# Patient Record
Sex: Female | Born: 2005 | Race: White | Hispanic: No | Marital: Single | Smoking: Never smoker
Health system: Southern US, Community
[De-identification: ages and names within clinical notes are randomized; demographics above are authoritative.]

## PROBLEM LIST (undated history)

## (undated) DIAGNOSIS — J309 Allergic rhinitis, unspecified: Secondary | ICD-10-CM

## (undated) HISTORY — DX: Allergic rhinitis, unspecified: J30.9

## (undated) HISTORY — PX: NO PAST SURGERIES: SHX2092

---
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
## 2024-01-23 ENCOUNTER — Encounter: Admitting: Family Medicine

## 2024-03-08 ENCOUNTER — Encounter: Payer: Self-pay | Admitting: Physician Assistant

## 2024-03-08 ENCOUNTER — Ambulatory Visit (INDEPENDENT_AMBULATORY_CARE_PROVIDER_SITE_OTHER): Admitting: Physician Assistant

## 2024-03-08 VITALS — BP 113/72 | HR 118 | Resp 16 | Ht 59.45 in | Wt 157.5 lb

## 2024-03-08 DIAGNOSIS — F419 Anxiety disorder, unspecified: Secondary | ICD-10-CM

## 2024-03-08 DIAGNOSIS — E669 Obesity, unspecified: Secondary | ICD-10-CM

## 2024-03-08 DIAGNOSIS — Z30011 Encounter for initial prescription of contraceptive pills: Secondary | ICD-10-CM | POA: Diagnosis not present

## 2024-03-08 DIAGNOSIS — N921 Excessive and frequent menstruation with irregular cycle: Secondary | ICD-10-CM

## 2024-03-08 DIAGNOSIS — Z7689 Persons encountering health services in other specified circumstances: Secondary | ICD-10-CM

## 2024-03-08 LAB — POCT URINE PREGNANCY: Preg Test, Ur: NEGATIVE

## 2024-03-08 MED ORDER — SERTRALINE HCL 25 MG PO TABS: 25.0000 mg | ORAL_TABLET | Freq: Every day | ORAL | 1 refills | Status: AC

## 2024-03-08 MED ORDER — HYDROXYZINE HCL 10 MG PO TABS: 10.0000 mg | ORAL_TABLET | Freq: Three times a day (TID) | ORAL | 0 refills | Status: DC | PRN

## 2024-03-08 MED ORDER — NORETHIN ACE-ETH ESTRAD-FE 1-20 MG-MCG PO TABS: 1.0000 | ORAL_TABLET | Freq: Every day | ORAL | 4 refills | Status: AC

## 2024-03-08 NOTE — Progress Notes (Signed)
 Established patient visit  Patient: Norma Moore   DOB: [birth year]   17 y.o. Female  MRN: [id number] Visit Date: 03/08/2024  Today's healthcare provider: Jolynn Spencer, PA-C   Chief Complaint  Patient presents with   Transitions Of Care   Contraception   Anxiety    Discuss treatment for anxiety   Subjective     HPI     Anxiety    Additional comments: Discuss treatment for anxiety      Last edited by Wilfred Hargis RAMAN, CMA on 03/08/2024  2:32 PM.       Discussed the use of AI scribe software for clinical note transcription with the patient, who gave verbal consent to proceed.  History of Present Illness Norma Moore is an 17 year old female who presents with anxiety and heavy menstrual periods.  She experiences heavy menstrual periods throughout the entire cycle, with severe cramps on the first day. Previously, her symptoms were managed with the contraceptive pill, but she is not currently using any birth control.  She has increased anxiety over the past two and a half weeks, particularly severe at night, causing difficulty sitting still. Her mother has a history of anxiety and takes Zoloft . She has had anxiety in the past, but it has not required medication until now.  She denies chest pain, shortness of breath, palpitations, or bowel movement issues.       03/08/2024    2:39 PM 05/27/2022    3:39 PM 04/05/2018    1:45 PM  Depression screen PHQ 2/9  Decreased Interest 0 3 0  Down, Depressed, Hopeless 1 2 0  PHQ - 2 Score 1 5 0  Altered sleeping 1 2 0  Tired, decreased energy 0 3 0  Change in appetite 2 1 0  Feeling bad or failure about yourself  0 3 0  Trouble concentrating 0 3 0  Moving slowly or fidgety/restless 0 0 0  Suicidal thoughts 0 2 0  PHQ-9 Score 4 19 0  Difficult doing work/chores Not difficult at all Very difficult Not difficult at all      03/08/2024    2:39 PM  GAD 7 : Generalized Anxiety Score  Nervous, Anxious, on Edge 3  Control/stop  worrying 3  Worry too much - different things 3  Trouble relaxing 3  Restless 2  Easily annoyed or irritable 3  Afraid - awful might happen 3  Total GAD 7 Score 20  Anxiety Difficulty Somewhat difficult    Medications: Outpatient Medications Prior to Visit  Medication Sig   [DISCONTINUED] norethindrone-ethinyl estradiol-FE (LOESTRIN FE) 1-20 MG-MCG tablet Take 1 tablet by mouth daily.   No facility-administered medications prior to visit.    Review of Systems All negative Except see HPI       Objective    BP 113/72 (BP Location: Right Arm, Patient Position: Sitting, Cuff Size: Large)   Pulse (!) 118   Resp 16   Ht 4' 11.45 (1.51 m)   Wt 157 lb 8 oz (71.4 kg)   LMP 02/20/2024   SpO2 98%   BMI 31.33 kg/m     Physical Exam Vitals reviewed.  Constitutional:      General: She is not in acute distress.    Appearance: Normal appearance. She is well-developed. She is not diaphoretic.  HENT:     Head: Normocephalic and atraumatic.  Eyes:     General: No scleral icterus.    Conjunctiva/sclera: Conjunctivae normal.  Neck:  Thyroid : No thyromegaly.  Cardiovascular:     Rate and Rhythm: Normal rate and regular rhythm.     Pulses: Normal pulses.     Heart sounds: Normal heart sounds. No murmur heard. Pulmonary:     Effort: Pulmonary effort is normal. No respiratory distress.     Breath sounds: Normal breath sounds. No wheezing, rhonchi or rales.  Musculoskeletal:     Cervical back: Neck supple.     Right lower leg: No edema.     Left lower leg: No edema.  Lymphadenopathy:     Cervical: No cervical adenopathy.  Skin:    General: Skin is warm and dry.     Findings: No rash.  Neurological:     Mental Status: She is alert and oriented to person, place, and time. Mental status is at baseline.  Psychiatric:        Mood and Affect: Mood normal.        Behavior: Behavior normal.      Results for orders placed or performed in visit on 03/08/24  POCT urine  pregnancy  Result Value Ref Range   Preg Test, Ur Negative Negative        Assessment & Plan Anxiety disorder Anxiety increased over two and a half weeks, severe at night, causing restlessness and sleep difficulty. No prior medication, family history present. Discussed medication and counseling benefits. Hydroxyzine  Atarax  suggested for immediate relief, potential transition to sertraline  (Zoloft ). Counseling recommended to enhance treatment efficacy.  Patient declined - Prescribed hydroxyzine  Atarax , small dosage, up to three times daily as needed. Advised caution with machinery or driving. - Prescribed sertraline  (Zoloft ) 25 mg, start with half a tablet. Monitor response over six weeks. - Recommended in-house counseling services, covered by insurance, or through school/work if convenient. - Scheduled follow-up in two months to assess medication efficacy and progress. Will follow-up  Heavy menstrual bleeding with dysmenorrhea Heavy menstrual bleeding with severe cramps on the first day. Previously managed with oral contraceptive pills. Further evaluation with imaging and possible OBGYN referral if symptoms persist. - Consider OTC NSAIDs for pain relief, with caution for stomach lining issues. - Monitor symptoms and consider further evaluation if symptoms persist.  Visit for oral contraceptive prescription (Primary)  - POCT urine pregnancy - norethindrone-ethinyl estradiol-FE (LOESTRIN FE) 1-20 MG-MCG tablet; Take 1 tablet by mouth daily.  Dispense: 84 tablet; Refill: 4  Anxiety  - sertraline  (ZOLOFT ) 25 MG tablet; Take 1 tablet (25 mg total) by mouth daily.  Dispense: 30 tablet; Refill: 1 - hydrOXYzine  (ATARAX ) 10 MG tablet; Take 1 tablet (10 mg total) by mouth 3 (three) times daily as needed.  Dispense: 30 tablet; Refill: 0 - Lipid panel  Obesity (BMI 30-39.9) Chronic and stable Body mass index is 31.33 kg/m. Weight loss of 5% of pt's current weight via healthy diet and daily  exercise encouraged. Workup ordered - Lipid panel - Hemoglobin A1c - Comprehensive metabolic panel with GFR - CBC with Differential/Platelet - TSH Will reassess after  receiving lab results   Orders Placed This Encounter  Procedures   Lipid panel    Has the patient fasted?:   Yes   Hemoglobin A1c   Comprehensive metabolic panel with GFR    Has the patient fasted?:   Yes   CBC with Differential/Platelet   TSH   POCT urine pregnancy    Return in about 8 weeks (around 05/03/2024) for CPE, anxiety fu.   The patient was advised to call back or seek an in-person evaluation  if the symptoms worsen or if the condition fails to improve as anticipated.  I discussed the assessment and treatment plan with the patient. The patient was provided an opportunity to ask questions and all were answered. The patient agreed with the plan and demonstrated an understanding of the instructions.  I, Tarrin Lebow, PA-C have reviewed all documentation for this visit. The documentation on 03/08/2024  for the exam, diagnosis, procedures, and orders are all accurate and complete.  Jolynn Spencer, [facility], MMS [facility] [phone] (phone) [phone] (fax)  [facility]

## 2024-03-13 DIAGNOSIS — Z30011 Encounter for initial prescription of contraceptive pills: Secondary | ICD-10-CM | POA: Insufficient documentation

## 2024-03-13 DIAGNOSIS — F419 Anxiety disorder, unspecified: Secondary | ICD-10-CM | POA: Insufficient documentation

## 2024-03-13 DIAGNOSIS — N921 Excessive and frequent menstruation with irregular cycle: Secondary | ICD-10-CM | POA: Insufficient documentation

## 2024-03-13 DIAGNOSIS — E669 Obesity, unspecified: Secondary | ICD-10-CM | POA: Insufficient documentation

## 2024-03-13 DIAGNOSIS — N922 Excessive menstruation at puberty: Secondary | ICD-10-CM | POA: Insufficient documentation

## 2024-05-02 ENCOUNTER — Ambulatory Visit: Admitting: Physician Assistant

## 2024-05-02 ENCOUNTER — Encounter: Payer: Self-pay | Admitting: Physician Assistant

## 2024-05-02 VITALS — BP 117/83 | HR 93 | Ht 59.0 in | Wt 162.5 lb

## 2024-05-02 DIAGNOSIS — Z23 Encounter for immunization: Secondary | ICD-10-CM | POA: Diagnosis not present

## 2024-05-02 DIAGNOSIS — Z0001 Encounter for general adult medical examination with abnormal findings: Secondary | ICD-10-CM

## 2024-05-02 DIAGNOSIS — E669 Obesity, unspecified: Secondary | ICD-10-CM | POA: Diagnosis not present

## 2024-05-02 DIAGNOSIS — F419 Anxiety disorder, unspecified: Secondary | ICD-10-CM

## 2024-05-02 MED ORDER — HYDROXYZINE HCL 10 MG PO TABS
10.0000 mg | ORAL_TABLET | Freq: Three times a day (TID) | ORAL | 2 refills | Status: AC | PRN
Start: 2024-05-02 — End: ?

## 2024-05-02 NOTE — Progress Notes (Signed)
 Established patient visit  Patient: Norma Moore   DOB: 16-Aug-2005   17 y.o. Female  MRN: [id number] Visit Date: 05/02/2024  Today's healthcare provider: Jolynn Spencer, PA-C   Chief Complaint  Patient presents with   Annual Exam   Medical Management of Chronic Issues    Anxiety follow-up   Subjective     HPI     Medical Management of Chronic Issues    Additional comments: Anxiety follow-up      Last edited by Rosas, Joseline E, CMA on 05/02/2024  3:01 PM.       Discussed the use of AI scribe software for clinical note transcription with the patient, who gave verbal consent to proceed.  History of Present Illness Norma Moore is an 18 year old female who presents for vaccination updates and anxiety management.  She is due for vaccination updates, receiving her first meningitis shot and second HPV shot, having missed the latter due to a change in healthcare location.  She has anxiety and depression, previously managed with Zoloft  and hydroxyzine . Hydroxyzine  provided more relief, taken three times daily, sometimes two at a time during heightened anxiety. She is currently out of both medications.  She experiences occasional nasal congestion and a runny nose, not attributed to seasonal allergies. No heat or cold intolerance, lumps in the neck, hoarseness, fatigue, or tiredness.  Her bowel movements are regular, and urination is normal without pain. She is on birth control, which has caused appetite and weight fluctuations. Menstrual bleeding has improved since starting birth control, with her last period on August 23rd being significantly better.  She recently graduated high school, is seeking employment, and plans to work before attending college. She is considering a career in bartending.       05/02/2024    2:59 PM 03/08/2024    2:39 PM 05/27/2022    3:39 PM  Depression screen PHQ 2/9  Decreased Interest 0 0 3  Down, Depressed, Hopeless 0 1 2  PHQ - 2 Score 0 1 5   Altered sleeping 0 1 2  Tired, decreased energy 0 0 3  Change in appetite 0 2 1  Feeling bad or failure about yourself  0 0 3  Trouble concentrating 0 0 3  Moving slowly or fidgety/restless 0 0 0  Suicidal thoughts 0 0 2  PHQ-9 Score 0 4 19  Difficult doing work/chores Not difficult at all Not difficult at all Very difficult      05/02/2024    3:00 PM 03/08/2024    2:39 PM  GAD 7 : Generalized Anxiety Score  Nervous, Anxious, on Edge 1 3  Control/stop worrying 0 3  Worry too much - different things 0 3  Trouble relaxing 0 3  Restless 0 2  Easily annoyed or irritable 0 3  Afraid - awful might happen 0 3  Total GAD 7 Score 1 20  Anxiety Difficulty Not difficult at all Somewhat difficult    Medications: Outpatient Medications Prior to Visit  Medication Sig   hydrOXYzine  (ATARAX ) 10 MG tablet Take 1 tablet (10 mg total) by mouth 3 (three) times daily as needed.   norethindrone-ethinyl estradiol-FE (LOESTRIN FE) 1-20 MG-MCG tablet Take 1 tablet by mouth daily.   sertraline  (ZOLOFT ) 25 MG tablet Take 1 tablet (25 mg total) by mouth daily.   No facility-administered medications prior to visit.    Review of Systems All negative Except see HPI       Objective    BP 117/83 (  BP Location: Right Arm, Patient Position: Sitting, Cuff Size: Large)   Pulse 93   Ht 4' 11 (1.499 m)   Wt 162 lb 8 oz (73.7 kg)   LMP 03/31/2024   SpO2 100%   BMI 32.82 kg/m     Physical Exam Vitals reviewed.  Constitutional:      General: She is not in acute distress.    Appearance: Normal appearance. She is well-developed. She is not ill-appearing, toxic-appearing or diaphoretic.  HENT:     Head: Normocephalic and atraumatic.     Right Ear: Tympanic membrane, ear canal and external ear normal.     Left Ear: Tympanic membrane, ear canal and external ear normal.     Nose: Congestion and rhinorrhea present.     Mouth/Throat:     Mouth: Mucous membranes are moist.     Pharynx: Oropharynx is  clear. No oropharyngeal exudate.  Eyes:     General: No scleral icterus.       Right eye: No discharge.        Left eye: No discharge.     Conjunctiva/sclera: Conjunctivae normal.     Pupils: Pupils are equal, round, and reactive to light.  Neck:     Thyroid : No thyromegaly.     Vascular: No carotid bruit.  Cardiovascular:     Rate and Rhythm: Normal rate and regular rhythm.     Pulses: Normal pulses.     Heart sounds: Normal heart sounds. No murmur heard.    No friction rub. No gallop.  Pulmonary:     Effort: Pulmonary effort is normal. No respiratory distress.     Breath sounds: Normal breath sounds. No wheezing or rales.  Abdominal:     General: Abdomen is flat. Bowel sounds are normal. There is no distension.     Palpations: Abdomen is soft. There is no mass.     Tenderness: There is no abdominal tenderness. There is no right CVA tenderness, left CVA tenderness, guarding or rebound.     Hernia: No hernia is present.  Musculoskeletal:        General: No swelling, tenderness, deformity or signs of injury. Normal range of motion.     Cervical back: Normal range of motion and neck supple. No rigidity or tenderness.     Right lower leg: No edema.     Left lower leg: No edema.  Lymphadenopathy:     Cervical: No cervical adenopathy.  Skin:    General: Skin is warm and dry.     Coloration: Skin is not jaundiced or pale.     Findings: No bruising, erythema, lesion or rash.  Neurological:     Mental Status: She is alert and oriented to person, place, and time. Mental status is at baseline.     Gait: Gait normal.  Psychiatric:        Mood and Affect: Mood normal.        Behavior: Behavior normal.        Thought Content: Thought content normal.        Judgment: Judgment normal.      No results found for any visits on 05/02/24.      Assessment and Plan Assessment & Plan Annual physical exam Well Child Visit Routine visit for 18 year old female, no acute concerns. Recent  dental visit for cavity fillings. No issues with exercise or sleep. - Schedule follow-up in one year unless issues arise.  Anticipatory Guidance Discussed vaccinations including meningitis and HPV, emphasized importance of completing  vaccination schedule. Discussed nasal congestion and potential seasonal allergies. Advised on COVID-19 precautions. - Schedule first meningitis vaccine. - Administer second HPV vaccine. - Schedule follow-up for meningitis vaccine in six weeks. - Advise wearing a mask indoors due to COVID-19. - Recommend nasal saline spray, Flonase, or antihistamines for nasal congestion.  Nasal congestion Intermittent nasal congestion with runny nose, possibly related to seasonal changes. - Advise use of nasal saline spray, Flonase, or antihistamines as needed.  Anxiety and Depression Chronic and stable Anxiety and depression managed with hydroxyzine , better response than Zoloft . Symptoms well-managed with medication and lifestyle changes. No current need for counseling. - Prescribe hydroxyzine  90 tablets with refills for two months. - Discontinue Zoloft . - Reassess medication needs in three months. Consider collaborative care if symptoms worsen Will follow-up  Anxiety (Primary)  - hydrOXYzine  (ATARAX ) 10 MG tablet; Take 1 tablet (10 mg total) by mouth 3 (three) times daily as needed for anxiety.  Dispense: 90 tablet; Refill: 2  Need for meningococcal vaccination  - Meningococcal B, OMV  Need for HPV vaccination  - HPV 9-valent vaccine,Recombinat  Obesity (BMI 30-39.9) Chronic Stable Body mass index is 32.82 kg/m.  Weight loss of 5% of pt's current weight via healthy diet and daily exercise encouraged. Lab work from 03/08/24 pending Will follow-up    No orders of the defined types were placed in this encounter.   No follow-ups on file.   The patient was advised to call back or seek an in-person evaluation if the symptoms worsen or if the condition fails  to improve as anticipated.  I discussed the assessment and treatment plan with the patient. The patient was provided an opportunity to ask questions and all were answered. The patient agreed with the plan and demonstrated an understanding of the instructions.  I, Kadan Millstein, PA-C have reviewed all documentation for this visit. The documentation on 05/02/2024  for the exam, diagnosis, procedures, and orders are all accurate and complete.  Jolynn Spencer, [facility], MMS [facility] [phone] (phone) [phone] (fax)  [facility]

## 2024-06-13 ENCOUNTER — Ambulatory Visit: Admitting: Physician Assistant

## 2024-08-08 ENCOUNTER — Ambulatory Visit: Admitting: Physician Assistant

## 2024-08-16 ENCOUNTER — Ambulatory Visit: Admitting: Physician Assistant

## 2025-05-08 ENCOUNTER — Encounter: Admitting: Physician Assistant
